# Patient Record
Sex: Female | Born: 1990 | Race: Black or African American | Hispanic: No | Marital: Single | Smoking: Never smoker
Health system: Southern US, Community
[De-identification: ages and names within clinical notes are randomized; demographics above are authoritative.]

---

## 2015-07-09 ENCOUNTER — Emergency Department: Payer: Medicaid Other

## 2015-07-09 ENCOUNTER — Emergency Department
Admission: EM | Admit: 2015-07-09 | Discharge: 2015-07-09 | Disposition: A | Discharge status: Home / Self Care | Payer: Medicaid Other | Attending: Emergency Medicine | Admitting: Emergency Medicine

## 2015-07-09 ENCOUNTER — Encounter: Payer: Self-pay | Admitting: Emergency Medicine

## 2015-07-09 DIAGNOSIS — R197 Diarrhea, unspecified: Secondary | ICD-10-CM | POA: Diagnosis not present

## 2015-07-09 DIAGNOSIS — R112 Nausea with vomiting, unspecified: Secondary | ICD-10-CM

## 2015-07-09 DIAGNOSIS — R1011 Right upper quadrant pain: Secondary | ICD-10-CM | POA: Diagnosis not present

## 2015-07-09 DIAGNOSIS — Z3202 Encounter for pregnancy test, result negative: Secondary | ICD-10-CM | POA: Diagnosis not present

## 2015-07-09 DIAGNOSIS — R109 Unspecified abdominal pain: Secondary | ICD-10-CM

## 2015-07-09 LAB — COMPREHENSIVE METABOLIC PANEL
ALK PHOS: 103 U/L (ref 38–126)
ALT: 44 U/L (ref 14–54)
AST: 31 U/L (ref 15–41)
Albumin: 4 g/dL (ref 3.5–5.0)
Anion gap: 10 (ref 5–15)
BUN: 8 mg/dL (ref 6–20)
CALCIUM: 8.7 mg/dL — AB (ref 8.9–10.3)
CO2: 18 mmol/L — ABNORMAL LOW (ref 22–32)
CREATININE: 0.65 mg/dL (ref 0.44–1.00)
Chloride: 107 mmol/L (ref 101–111)
Glucose, Bld: 129 mg/dL — ABNORMAL HIGH (ref 65–99)
Potassium: 4.3 mmol/L (ref 3.5–5.1)
Sodium: 135 mmol/L (ref 135–145)
Total Bilirubin: <0.1 mg/dL — ABNORMAL LOW (ref 0.3–1.2)
Total Protein: 7.9 g/dL (ref 6.5–8.1)

## 2015-07-09 LAB — CBC WITH DIFFERENTIAL/PLATELET
BASOS ABS: 0 10*3/uL (ref 0–0.1)
Basophils Relative: 0 %
Eosinophils Absolute: 0 10*3/uL (ref 0–0.7)
Eosinophils Relative: 0 %
HEMATOCRIT: 38 % (ref 35.0–47.0)
HEMOGLOBIN: 12.5 g/dL (ref 12.0–16.0)
LYMPHS PCT: 10 %
Lymphs Abs: 1.1 10*3/uL (ref 1.0–3.6)
MCH: 26.4 pg (ref 26.0–34.0)
MCHC: 32.9 g/dL (ref 32.0–36.0)
MCV: 80.2 fL (ref 80.0–100.0)
MONO ABS: 0.5 10*3/uL (ref 0.2–0.9)
Monocytes Relative: 5 %
NEUTROS ABS: 8.9 10*3/uL — AB (ref 1.4–6.5)
NEUTROS PCT: 85 %
Platelets: 274 10*3/uL (ref 150–440)
RBC: 4.74 MIL/uL (ref 3.80–5.20)
RDW: 15 % — AB (ref 11.5–14.5)
WBC: 10.5 10*3/uL (ref 3.6–11.0)

## 2015-07-09 LAB — URINALYSIS COMPLETE WITH MICROSCOPIC (ARMC ONLY)
Bilirubin Urine: NEGATIVE
GLUCOSE, UA: NEGATIVE mg/dL
Ketones, ur: NEGATIVE mg/dL
NITRITE: NEGATIVE
Protein, ur: NEGATIVE mg/dL
SPECIFIC GRAVITY, URINE: 1.024 (ref 1.005–1.030)
pH: 6 (ref 5.0–8.0)

## 2015-07-09 LAB — POCT PREGNANCY, URINE: PREG TEST UR: NEGATIVE

## 2015-07-09 LAB — LIPASE, BLOOD: Lipase: 19 U/L (ref 11–51)

## 2015-07-09 MED ORDER — ONDANSETRON HCL 4 MG/2ML IJ SOLN
4.0000 mg | Freq: Once | INTRAMUSCULAR | Status: AC
  Administered 2015-07-09: 4 mg via INTRAVENOUS
  Filled 2015-07-09: qty 2

## 2015-07-09 MED ORDER — IOHEXOL 350 MG/ML SOLN
100.0000 mL | Freq: Once | INTRAVENOUS | Status: AC | PRN
Start: 2015-07-09 — End: 2015-07-09
  Administered 2015-07-09: 100 mL via INTRAVENOUS

## 2015-07-09 MED ORDER — IOHEXOL 240 MG/ML SOLN
25.0000 mL | Freq: Once | INTRAMUSCULAR | Status: AC | PRN
  Administered 2015-07-09: 25 mL via ORAL

## 2015-07-09 MED ORDER — DICYCLOMINE HCL 20 MG PO TABS: 20.0000 mg | ORAL_TABLET | Freq: Three times a day (TID) | ORAL | Status: AC | PRN

## 2015-07-09 MED ORDER — ONDANSETRON HCL 4 MG PO TABS: 4.0000 mg | ORAL_TABLET | Freq: Every day | ORAL | Status: AC | PRN

## 2015-07-09 MED ORDER — MORPHINE SULFATE (PF) 2 MG/ML IV SOLN
2.0000 mg | Freq: Once | INTRAVENOUS | Status: AC
  Administered 2015-07-09: 2 mg via INTRAVENOUS
  Filled 2015-07-09: qty 1

## 2015-07-09 MED ORDER — SODIUM CHLORIDE 0.9 % IV BOLUS (SEPSIS)
1000.0000 mL | Freq: Once | INTRAVENOUS | Status: AC
  Administered 2015-07-09: 1000 mL via INTRAVENOUS

## 2015-07-09 NOTE — ED Notes (Signed)
IV in rt ac per pt preference

## 2015-07-09 NOTE — ED Notes (Signed)
Ppt out of from to ct

## 2015-07-09 NOTE — Discharge Instructions (Signed)
Abdominal Pain, Adult °Many things can cause abdominal pain. Usually, abdominal pain is not caused by a disease and will improve without treatment. It can often be observed and treated at home. Your health care provider will do a physical exam and possibly order blood tests and X-rays to help determine the seriousness of your pain. However, in many cases, more time must pass before a clear cause of the pain can be found. Before that point, your health care provider may not know if you need more testing or further treatment. °HOME CARE INSTRUCTIONS °Monitor your abdominal pain for any changes. The following actions may help to alleviate any discomfort you are experiencing: °· Only take over-the-counter or prescription medicines as directed by your health care provider. °· Do not take laxatives unless directed to do so by your health care provider. °· Try a clear liquid diet (broth, tea, or water) as directed by your health care provider. Slowly move to a bland diet as tolerated. °SEEK MEDICAL CARE IF: °· You have unexplained abdominal pain. °· You have abdominal pain associated with nausea or diarrhea. °· You have pain when you urinate or have a bowel movement. °· You experience abdominal pain that wakes you in the night. °· You have abdominal pain that is worsened or improved by eating food. °· You have abdominal pain that is worsened with eating fatty foods. °· You have a fever. °SEEK IMMEDIATE MEDICAL CARE IF: °· Your pain does not go away within 2 hours. °· You keep throwing up (vomiting). °· Your pain is felt only in portions of the abdomen, such as the right side or the left lower portion of the abdomen. °· You pass bloody or black tarry stools. °MAKE SURE YOU: °· Understand these instructions. °· Will watch your condition. °· Will get help right away if you are not doing well or get worse. °  °This information is not intended to replace advice given to you by your health care provider. Make sure you discuss  any questions you have with your health care provider. °  °Document Released: 02/25/2005 Document Revised: 02/06/2015 Document Reviewed: 01/25/2013 °Elsevier Interactive Patient Education ©2016 [employer] ° °Diarrhea °Diarrhea is frequent loose and watery bowel movements. It can cause you to feel weak and dehydrated. Dehydration can cause you to become tired and thirsty, have a dry mouth, and have decreased urination that often is dark yellow. Diarrhea is a sign of another problem, most often an infection that will not last long. In most cases, diarrhea typically lasts 2-3 days. However, it can last longer if it is a sign of something more serious. It is important to treat your diarrhea as directed by your caregiver to lessen or prevent future episodes of diarrhea. °CAUSES  °Some common causes include: °· Gastrointestinal infections caused by viruses, bacteria, or parasites. °· Food poisoning or food allergies. °· Certain medicines, such as antibiotics, chemotherapy, and laxatives. °· Artificial sweeteners and fructose. °· Digestive disorders. °HOME CARE INSTRUCTIONS °· Ensure adequate fluid intake (hydration): Have 1 cup (8 oz) of fluid for each diarrhea episode. Avoid fluids that contain simple sugars or sports drinks, fruit juices, whole milk products, and sodas. Your urine should be clear or pale yellow if you are drinking enough fluids. Hydrate with an oral rehydration solution that you can purchase at pharmacies, retail stores, and online. You can prepare an oral rehydration solution at home by mixing the following ingredients together: °·  - tsp table salt. °· ¾ tsp baking soda. °·    tsp salt substitute containing potassium chloride. °· 1  tablespoons sugar. °· 1 L (34 oz) of water. °· Certain foods and beverages may increase the speed at which food moves through the gastrointestinal (GI) tract. These foods and beverages should be avoided and include: °· Caffeinated and alcoholic beverages. °· High-fiber  foods, such as raw fruits and vegetables, nuts, seeds, and whole grain breads and cereals. °· Foods and beverages sweetened with sugar alcohols, such as xylitol, sorbitol, and mannitol. °· Some foods may be well tolerated and may help thicken stool including: °· Starchy foods, such as rice, toast, pasta, low-sugar cereal, oatmeal, grits, baked potatoes, crackers, and bagels. °· Bananas. °· Applesauce. °· Add probiotic-rich foods to help increase healthy bacteria in the GI tract, such as yogurt and fermented milk products. °· Wash your hands well after each diarrhea episode. °· Only take over-the-counter or prescription medicines as directed by your caregiver. °· Take a warm bath to relieve any burning or pain from frequent diarrhea episodes. °SEEK IMMEDIATE MEDICAL CARE IF:  °· You are unable to keep fluids down. °· You have persistent vomiting. °· You have blood in your stool, or your stools are black and tarry. °· You do not urinate in 6-8 hours, or there is only a small amount of very dark urine. °· You have abdominal pain that increases or localizes. °· You have weakness, dizziness, confusion, or light-headedness. °· You have a severe headache. °· Your diarrhea gets worse or does not get better. °· You have a fever or persistent symptoms for more than 2-3 days. °· You have a fever and your symptoms suddenly get worse. °MAKE SURE YOU:  °· Understand these instructions. °· Will watch your condition. °· Will get help right away if you are not doing well or get worse. °  °This information is not intended to replace advice given to you by your health care provider. Make sure you discuss any questions you have with your health care provider. °  °Document Released: 05/08/2002 Document Revised: 06/08/2014 Document Reviewed: 01/24/2012 °Elsevier Interactive Patient Education ©2016 [employer] ° °Nausea and Vomiting °Nausea is a sick feeling that often comes before throwing up (vomiting). Vomiting is a reflex where  stomach contents come out of your mouth. Vomiting can cause severe loss of body fluids (dehydration). Children and elderly adults can become dehydrated quickly, especially if they also have diarrhea. Nausea and vomiting are symptoms of a condition or disease. It is important to find the cause of your symptoms. °CAUSES  °· Direct irritation of the stomach lining. This irritation can result from increased acid production (gastroesophageal reflux disease), infection, food poisoning, taking certain medicines (such as nonsteroidal anti-inflammatory drugs), alcohol use, or tobacco use. °· Signals from the brain. These signals could be caused by a headache, heat exposure, an inner ear disturbance, increased pressure in the brain from injury, infection, a tumor, or a concussion, pain, emotional stimulus, or metabolic problems. °· An obstruction in the gastrointestinal tract (bowel obstruction). °· Illnesses such as diabetes, hepatitis, gallbladder problems, appendicitis, kidney problems, cancer, sepsis, atypical symptoms of a heart attack, or eating disorders. °· Medical treatments such as chemotherapy and radiation. °· Receiving medicine that makes you sleep (general anesthetic) during surgery. °DIAGNOSIS °Your caregiver may ask for tests to be done if the problems do not improve after a few days. Tests may also be done if symptoms are severe or if the reason for the nausea and vomiting is not clear. Tests may include: °· Urine tests. °·   Blood tests. °· Stool tests. °· Cultures (to look for evidence of infection). °· X-rays or other imaging studies. °Test results can help your caregiver make decisions about treatment or the need for additional tests. °TREATMENT °You need to stay well hydrated. Drink frequently but in small amounts. You may wish to drink water, sports drinks, clear broth, or eat frozen ice pops or gelatin dessert to help stay hydrated. When you eat, eating slowly may help prevent nausea. There are also some  antinausea medicines that may help prevent nausea. °HOME CARE INSTRUCTIONS  °· Take all medicine as directed by your caregiver. °· If you do not have an appetite, do not force yourself to eat. However, you must continue to drink fluids. °· If you have an appetite, eat a normal diet unless your caregiver tells you differently. °¨ Eat a variety of complex carbohydrates (rice, wheat, potatoes, bread), lean meats, yogurt, fruits, and vegetables. °¨ Avoid high-fat foods because they are more difficult to digest. °· Drink enough water and fluids to keep your urine clear or pale yellow. °· If you are dehydrated, ask your caregiver for specific rehydration instructions. Signs of dehydration may include: °¨ Severe thirst. °¨ Dry lips and mouth. °¨ Dizziness. °¨ Dark urine. °¨ Decreasing urine frequency and amount. °¨ Confusion. °¨ Rapid breathing or pulse. °SEEK IMMEDIATE MEDICAL CARE IF:  °· You have blood or brown flecks (like coffee grounds) in your vomit. °· You have black or bloody stools. °· You have a severe headache or stiff neck. °· You are confused. °· You have severe abdominal pain. °· You have chest pain or trouble breathing. °· You do not urinate at least once every 8 hours. °· You develop cold or clammy skin. °· You continue to vomit for longer than 24 to 48 hours. °· You have a fever. °MAKE SURE YOU:  °· Understand these instructions. °· Will watch your condition. °· Will get help right away if you are not doing well or get worse. °  °This information is not intended to replace advice given to you by your health care provider. Make sure you discuss any questions you have with your health care provider. °  °Document Released: 05/18/2005 Document Revised: 08/10/2011 Document Reviewed: 10/15/2010 °Elsevier Interactive Patient Education ©2016 Elsevier Inc. ° °

## 2015-07-09 NOTE — ED Notes (Signed)
Patient reports abdominal pain with N/V/D that began at 0400 this am. States she has had two episodes of vomiting in last hour, 3 episodes of diarrhea in last hour. Patient denies any fevers. States pain began in middle of abdomen, now having pain to RUQ.

## 2015-07-09 NOTE — ED Notes (Signed)
Patient's purple top tube hemolyzed per lab. Patient was difficult stick with slow blood draw. Will wait until patient is in room and IV is placed to recollect.

## 2015-07-09 NOTE — ED Provider Notes (Signed)
[facility] Emergency Department Provider Note  ____________________________________________  Time seen: Approximately 325 PM  I have reviewed the triage vital signs and the nursing notes.   HISTORY  Chief Complaint Abdominal Pain; Diarrhea; and Emesis    HPI Debra Berry is a 25 y.o. female with no chronic medical problems was presenting to the emergency department today with nausea, vomiting and diarrhea and right upper quadrant abdominal pain. She said that she was awoken this morning by right upper quadrant abdominal pain at 4 AM. She subsequently had 4 episodes of diarrhea and 3 episodes of vomiting. She said that the right upper quadrant pain was initially cramping and then became sharp and constant. It does not radiate anywhere. She has no history of any intra-abdominal surgeries. She does not report any vaginal discharge or vaginal bleeding. She denies any burning with urination. She has no known sick contacts. Denies any blood in her stool or her vomitus.   History reviewed. No pertinent past medical history.  There are no active problems to display for this patient.   History reviewed. No pertinent past surgical history.  No current outpatient prescriptions on file.  Allergies Shrimp and Tomato  No family history on file.  Social History Social History  Substance Use Topics  . Smoking status: Never Smoker   . Smokeless tobacco: None  . Alcohol Use: No    Review of Systems Constitutional: No fever/chills Eyes: No visual changes. ENT: No sore throat. Cardiovascular: Denies chest pain. Respiratory: Denies shortness of breath. Gastrointestinal:  No constipation. Genitourinary: Negative for dysuria. Musculoskeletal: Negative for back pain. Skin: Negative for rash. Neurological: Negative for headaches, focal weakness or numbness.  10-point ROS otherwise negative.  ____________________________________________   PHYSICAL  EXAM:  VITAL SIGNS: ED Triage Vitals  Enc Vitals Group     BP 07/09/15 1300 124/73 mmHg     Pulse Rate 07/09/15 1300 78     Resp 07/09/15 1300 20     Temp 07/09/15 1300 98.1 F (36.7 C)     Temp Source 07/09/15 1300 Oral     SpO2 07/09/15 1300 99 %     Weight 07/09/15 1303 298 lb (135.172 kg)     Height 07/09/15 1303  (1.803 m)     Head Cir --      Peak Flow --      Pain Score 07/09/15 1303 10     Pain Loc --      Pain Edu? --      Excl. in GC? --     Constitutional: Alert and oriented. Well appearing and in no acute distress. Eyes: Conjunctivae are normal. PERRL. EOMI. Head: Atraumatic. Nose: No congestion/rhinnorhea. Mouth/Throat: Mucous membranes are moist.  Neck: No stridor.   Cardiovascular: Normal rate, regular rhythm. Grossly normal heart sounds.  Good peripheral circulation. Respiratory: Normal respiratory effort.  No retractions. Lungs CTAB. Gastrointestinal: Soft with mild tenderness to palpation to the right upper quadrant and the right side of the abdomen. There is no right lower quadrant tenderness to palpation. No distention. No abdominal bruits. No CVA tenderness. Musculoskeletal: No lower extremity tenderness nor edema.  No joint effusions. Neurologic:  Normal speech and language. No gross focal neurologic deficits are appreciated. No gait instability. Skin:  Skin is warm, dry and intact. No rash noted. Psychiatric: Mood and affect are normal. Speech and behavior are normal.  ____________________________________________   LABS (all labs ordered are listed, but only abnormal results are displayed)  Labs Reviewed  COMPREHENSIVE METABOLIC PANEL - Abnormal; Notable for the following:    CO2 18 (*)    Glucose, Bld 129 (*)    Calcium 8.7 (*)    Total Bilirubin <0.1 (*)    All other components within normal limits  URINALYSIS COMPLETEWITH MICROSCOPIC ([facility] ONLY) - Abnormal; Notable for the following:    Color, Urine YELLOW (*)    APPearance HAZY (*)     Hgb urine dipstick 1+ (*)    Leukocytes, UA 1+ (*)    Bacteria, UA RARE (*)    Squamous Epithelial / LPF 6-30 (*)    All other components within normal limits  CBC WITH DIFFERENTIAL/PLATELET - Abnormal; Notable for the following:    RDW 15.0 (*)    Neutro Abs 8.9 (*)    All other components within normal limits  LIPASE, BLOOD  POC URINE PREG, ED  POCT PREGNANCY, URINE   ____________________________________________  EKG  ED ECG REPORT I, Arelia Longest, the attending physician, personally viewed and interpreted this ECG.   Date: 07/09/2015  EKG Time: 1307  Rate: 72  Rhythm: normal sinus rhythm  Axis: Normal axis  Intervals:none  ST&T Change: No ST segment elevation or depression. No abnormal T-wave inversion.  ____________________________________________  RADIOLOGY  Normal enhanced CT scan of the abdomen and pelvis with a normal visualized appendix. ____________________________________________   PROCEDURES    ____________________________________________   INITIAL IMPRESSION / ASSESSMENT AND PLAN / ED COURSE  Pertinent labs & imaging results that were available during my care of the patient were reviewed by me and considered in my medical decision making (see chart for details).  ----------------------------------------- 5:50 PM on 07/09/2015 -----------------------------------------  Patient with very reassuring labs as well as CAT scan the abdomen and pelvis. Feeling much improved after morphine and Zofran. Tolerated by mouth fluids. Will be discharged home with Zofran as well as Bentyl. Explained to the patient that the most was the cause of her symptoms is a virus.Return precautions such as any worsening pain or nausea or vomiting. Will follow-up with her primary care doctor at [facility]. Reviewed the CAT scan results with her as well as her vomiting party. ____________________________________________   FINAL CLINICAL IMPRESSION(S) / ED  DIAGNOSES  Abdominal pain with nausea vomiting and diarrhea.    Myrna Blazer, MD 07/09/15 [phone]

## 2015-07-09 NOTE — ED Notes (Signed)
Pt returned from ct

## 2017-06-01 ENCOUNTER — Encounter: Payer: Self-pay | Admitting: Emergency Medicine

## 2017-06-01 ENCOUNTER — Emergency Department: Payer: Medicaid Other

## 2017-06-01 ENCOUNTER — Emergency Department
Admission: EM | Admit: 2017-06-01 | Discharge: 2017-06-01 | Disposition: A | Discharge status: Home / Self Care | Payer: Medicaid Other | Attending: Emergency Medicine | Admitting: Emergency Medicine

## 2017-06-01 ENCOUNTER — Other Ambulatory Visit: Payer: Self-pay

## 2017-06-01 DIAGNOSIS — M25571 Pain in right ankle and joints of right foot: Secondary | ICD-10-CM | POA: Diagnosis present

## 2017-06-01 DIAGNOSIS — R2241 Localized swelling, mass and lump, right lower limb: Secondary | ICD-10-CM | POA: Insufficient documentation

## 2017-06-01 DIAGNOSIS — Z23 Encounter for immunization: Secondary | ICD-10-CM | POA: Insufficient documentation

## 2017-06-01 DIAGNOSIS — S99911A Unspecified injury of right ankle, initial encounter: Secondary | ICD-10-CM

## 2017-06-01 MED ORDER — TETANUS-DIPHTH-ACELL PERTUSSIS 5-2.5-18.5 LF-MCG/0.5 IM SUSP
0.5000 mL | Freq: Once | INTRAMUSCULAR | Status: AC
  Administered 2017-06-01: 0.5 mL via INTRAMUSCULAR
  Filled 2017-06-01: qty 0.5

## 2017-06-01 MED ORDER — KETOROLAC TROMETHAMINE 30 MG/ML IJ SOLN
30.0000 mg | Freq: Once | INTRAMUSCULAR | Status: AC
  Administered 2017-06-01: 30 mg via INTRAMUSCULAR
  Filled 2017-06-01: qty 1

## 2017-06-01 MED ORDER — IBUPROFEN 800 MG PO TABS: 800.0000 mg | ORAL_TABLET | Freq: Three times a day (TID) | ORAL | 0 refills | Status: AC | PRN

## 2017-06-01 NOTE — ED Provider Notes (Signed)
[facility] Emergency Department Provider Note  ____________________________________________  Time seen: Approximately 6:02 PM  I have reviewed the triage vital signs and the nursing notes.   HISTORY  Chief Complaint Ankle Pain    HPI Debra Berry is a 27 y.o. female that presents to the emergency department for evaluation of right ankle injury  yesterday.  Patient states that her daughter fell and grabbed onto her, which caused her to fall as well.  She is not sure which way her ankle twisted.  She had some numbness last night but this resolved.  She has an abrasion on her right knee.  She has not taken anything for pain.  She iced her ankle this morning.  She is unsure of last tetanus shot.  No additional injuries or concerns.    History reviewed. No pertinent past medical history.  There are no active problems to display for this patient.   History reviewed. No pertinent surgical history.  Prior to Admission medications   Medication Sig Start Date End Date Taking? Authorizing Provider  dicyclomine (BENTYL) 20 MG tablet Take 1 tablet (20 mg total) by mouth 3 (three) times daily as needed for spasms. 07/09/15 07/08/16  Myrna Blazer, MD  ibuprofen (ADVIL,MOTRIN) 800 MG tablet Take 1 tablet (800 mg total) by mouth every 8 (eight) hours as needed. 06/01/17   Enid Derry, PA-C  ondansetron (ZOFRAN) 4 MG tablet Take 1 tablet (4 mg total) by mouth daily as needed. 07/09/15   Myrna Blazer, MD    Allergies Shrimp [shellfish allergy] and Tomato  No family history on file.  Social History Social History   Tobacco Use  . Smoking status: Never Smoker  Substance Use Topics  . Alcohol use: No  . Drug use: Not on file     Review of Systems  Constitutional: No fever/chills Cardiovascular: No chest pain. Respiratory: No SOB. Gastrointestinal: No abdominal pain.   Musculoskeletal: Positive for ankle pain. Skin: Negative for rash,  ecchymosis. Neurological: Negative for tingling   ____________________________________________   PHYSICAL EXAM:  VITAL SIGNS: ED Triage Vitals  Enc Vitals Group     BP 06/01/17 1656 134/90     Pulse Rate 06/01/17 1656 83     Resp 06/01/17 1656 20     Temp 06/01/17 1656 98.2 F (36.8 C)     Temp Source 06/01/17 1656 Oral     SpO2 06/01/17 1656 99 %     Weight 06/01/17 1657 295 lb (133.8 kg)     Height 06/01/17 1657 5\' 11"  (1.803 m)     Head Circumference --      Peak Flow --      Pain Score 06/01/17 1656 10     Pain Loc --      Pain Edu? --      Excl. in GC? --      Constitutional: Alert and oriented. Well appearing and in no acute distress. Eyes: Conjunctivae are normal. PERRL. EOMI. Head: Atraumatic. ENT:      Ears:      Nose: No congestion/rhinnorhea.      Mouth/Throat: Mucous membranes are moist.  Neck: No stridor.  Cardiovascular: Normal rate, regular rhythm.  Good peripheral circulation.  Symmetric dorsalis pedis pulses bilaterally. Respiratory: Normal respiratory effort without tachypnea or retractions. Lungs CTAB. Good air entry to the bases with no decreased or absent breath sounds. Musculoskeletal: Full range of motion to all extremities. No gross deformities appreciated.  Mild tenderness to palpation and swelling  over lateral and anterior right ankle. No erythema. Neurologic:  Normal speech and language. No gross focal neurologic deficits are appreciated.  Skin:  Skin is warm, dry.  1 inch x 1 inch abrasion to right knee.   ____________________________________________   LABS (all labs ordered are listed, but only abnormal results are displayed)  Labs Reviewed - No data to display ____________________________________________  EKG   ____________________________________________  RADIOLOGY Lexine BatonI, Brynne Doane, personally viewed and evaluated these images (plain radiographs) as part of my medical decision making, as well as reviewing the written report by  the radiologist.  Dg Ankle Complete Right  Result Date: 06/01/2017 CLINICAL DATA:  Status post fall yesterday twisted ankle with pain. EXAM: RIGHT ANKLE - COMPLETE 3+ VIEW COMPARISON:  None. FINDINGS: There is no evidence of fracture, dislocation, or joint effusion. There is no evidence of arthropathy or other focal bone abnormality. Lateral soft tissue swelling is noted. IMPRESSION: No acute fracture or dislocation.  Lateral soft tissue swelling. Electronically Signed   By: Sherian ReinWei-Chen  Lin M.D.   On: 06/01/2017 17:47    ____________________________________________    PROCEDURES  Procedure(s) performed:    Procedures    Medications  ketorolac (TORADOL) 30 MG/ML injection 30 mg (30 mg Intramuscular Given 06/01/17 1837)  Tdap (BOOSTRIX) injection 0.5 mL (0.5 mLs Intramuscular Given 06/01/17 1838)     ____________________________________________   INITIAL IMPRESSION / ASSESSMENT AND PLAN / ED COURSE  Pertinent labs & imaging results that were available during my care of the patient were reviewed by me and considered in my medical decision making (see chart for details).  Review of the [place] CSRS was performed in accordance of the [facility] prior to dispensing any controlled drugs.   Patient presents emergency department for evaluation of ankle injury.  Vital signs and exam are reassuring.  X-ray consistent with lateral malleolus swelling.  Patient will be discharged home with prescriptions for ibuprofen. Patient is to follow up with PCP as directed. Patient is given ED precautions to return to the ED for any worsening or new symptoms.     ____________________________________________  FINAL CLINICAL IMPRESSION(S) / ED DIAGNOSES  Final diagnoses:  Injury of right ankle, initial encounter      NEW MEDICATIONS STARTED DURING THIS VISIT:  ED Discharge Orders        Ordered    ibuprofen (ADVIL,MOTRIN) 800 MG tablet  Every 8 hours PRN     06/01/17 1801          This chart was  dictated using voice recognition software/Dragon. Despite best efforts to proofread, errors can occur which can change the meaning. Any change was purely unintentional.    Enid DerryWagner, Guilherme Schwenke, PA-C 06/01/17 2026    Sharyn CreamerQuale, Mark, MD 06/01/17 2332

## 2017-06-01 NOTE — ED Triage Notes (Signed)
R ankle pain since fell yesterday.

## 2017-06-01 NOTE — ED Notes (Signed)
Pt reports that she injured right ankle when she slipped and fell yesterday - unable to bear weight on right ankle

## 2018-02-02 ENCOUNTER — Encounter: Payer: Self-pay | Admitting: Emergency Medicine

## 2018-02-02 ENCOUNTER — Emergency Department
Admission: EM | Admit: 2018-02-02 | Discharge: 2018-02-02 | Disposition: A | Discharge status: Home / Self Care | Payer: Medicaid Other | Attending: Emergency Medicine | Admitting: Emergency Medicine

## 2018-02-02 ENCOUNTER — Other Ambulatory Visit: Payer: Self-pay

## 2018-02-02 ENCOUNTER — Emergency Department: Payer: Medicaid Other

## 2018-02-02 DIAGNOSIS — M79622 Pain in left upper arm: Secondary | ICD-10-CM | POA: Diagnosis not present

## 2018-02-02 DIAGNOSIS — M79602 Pain in left arm: Secondary | ICD-10-CM

## 2018-02-02 MED ORDER — TRAMADOL HCL 50 MG PO TABS: 50.0000 mg | ORAL_TABLET | Freq: Four times a day (QID) | ORAL | 0 refills | Status: AC | PRN

## 2018-02-02 NOTE — Discharge Instructions (Signed)
Follow-up with your primary care provider in [facility] for any continued issues with your left arm.  Ultrasound today does not show any signs of a blood clot.  Continue taking prednisone until finished.  Tramadol as needed for pain.  After finishing the prednisone to begin taking an anti-inflammatory such as Aleve.  Take 2 twice a day which will equal the prescription strength.  Use warm compresses to the area 4 times a day.

## 2018-02-02 NOTE — ED Triage Notes (Signed)
Pt had birth control implant removed last week, has had left arm pain ever since, area with out redness or warmth.  Pt in NAD.

## 2018-02-02 NOTE — ED Provider Notes (Signed)
[facility] Emergency Department Provider Note   ____________________________________________   None    (approximate)  I have reviewed the triage vital signs and the nursing notes.   HISTORY  Chief Complaint Arm Pain   HPI Debra Berry is a 27 y.o. female presents to the emergency department with complaint of left upper arm pain.  Patient states that she had a birth control implant in her left arm that was removed on August 9 and a new one replaced.  She began having pain after 1 week.  She is continued to have pain for the last 3 weeks and saw her PCP last week and was placed on prednisone.  Currently she is taking a tapering dose which has not helped with the pain.  She denies any  fever, chills, nausea or vomiting.  She is unaware of any erythema to the area.  Currently she rates her pain as a 9/10.  History reviewed. No pertinent past medical history.  There are no active problems to display for this patient.   History reviewed. No pertinent surgical history.  Prior to Admission medications   Medication Sig Start Date End Date Taking? Authorizing Provider  dicyclomine (BENTYL) 20 MG tablet Take 1 tablet (20 mg total) by mouth 3 (three) times daily as needed for spasms. 07/09/15 07/08/16  Myrna Blazer, MD  ibuprofen (ADVIL,MOTRIN) 800 MG tablet Take 1 tablet (800 mg total) by mouth every 8 (eight) hours as needed. 06/01/17   Enid Derry, PA-C  ondansetron (ZOFRAN) 4 MG tablet Take 1 tablet (4 mg total) by mouth daily as needed. 07/09/15   Schaevitz, Myra Rude, MD  traMADol (ULTRAM) 50 MG tablet Take 1 tablet (50 mg total) by mouth every 6 (six) hours as needed. 02/02/18   Tommi Rumps, PA-C    Allergies Shrimp [shellfish allergy] and Tomato  No family history on file.  Social History Social History   Tobacco Use  . Smoking status: Never Smoker  . Smokeless tobacco: Never Used  Substance Use Topics  . Alcohol use: No  .  Drug use: Not on file    Review of Systems Constitutional: No fever/chills Cardiovascular: Denies chest pain. Respiratory: Denies shortness of breath. Musculoskeletal: Positive left upper arm pain. Skin: Positive for incision left upper arm. Neurological: Negative for headaches, focal weakness or numbness. ___________________________________________   PHYSICAL EXAM:  VITAL SIGNS: ED Triage Vitals  Enc Vitals Group     BP 02/02/18 0831 123/80     Pulse Rate 02/02/18 0830 77     Resp 02/02/18 0830 16     Temp 02/02/18 0830 98.5 F (36.9 C)     Temp Source 02/02/18 0830 Oral     SpO2 02/02/18 0830 98 %     Weight 02/02/18 0831 296 lb (134.3 kg)     Height 02/02/18 0831 5\' 11"  (1.803 m)     Head Circumference --      Peak Flow --      Pain Score 02/02/18 0831 9     Pain Loc --      Pain Edu? --      Excl. in GC? --    Constitutional: Alert and oriented. Well appearing and in no acute distress. Eyes: Conjunctivae are normal.  Head: Atraumatic. Neck: No stridor.   Cardiovascular: Normal rate, regular rhythm. Grossly normal heart sounds.  Good peripheral circulation. Respiratory: Normal respiratory effort.  No retractions. Lungs CTAB. Musculoskeletal: On examination of left upper arm there is  no gross deformity however there is soft tissue tenderness at the site of the recent implant for birth control as well as the area in which the new implant was placed.  Range of motion is without crepitus or restriction.  No actual erythema or warmth is appreciated. Neurologic:  Normal speech and language. No gross focal neurologic deficits are appreciated.  Skin:  Skin is warm, dry and intact.  No erythema, ecchymosis or abrasions were seen.  There is a well healed scar from recent procedure.  No evidence of infection is noted. Psychiatric: Mood and affect are normal. Speech and behavior are normal.  ____________________________________________   LABS (all labs ordered are listed, but  only abnormal results are displayed)  Labs Reviewed - No data to display  RADIOLOGY   Official radiology report(s): US Venous Img Upper Uni Left  Result Date: 02/02/2018 CLINICAL DATA:  Left upper extremity pain for the past 3 weeks. Patient recently underwent pacemaker revision. Evaluate for DVT. EXAM: LEFT UPPER EXTREMITY VENOUS DOPPLER ULTRASOUND TECHNIQUE: Gray-scale sonography with graded compression, as well as color Doppler and duplex ultrasound were performed to evaluate the upper extremity deep venous system from the level of the subclavian vein and including the jugular, axillary, basilic, radial, ulnar and upper cephalic vein. Spectral Doppler was utilized to evaluate flow at rest and with distal augmentation maneuvers. COMPARISON:  None. FINDINGS: Contralateral Subclavian Vein: Respiratory phasicity is normal and symmetric with the symptomatic side. No evidence of thrombus. Normal compressibility. Internal Jugular Vein: No evidence of thrombus. Normal compressibility, respiratory phasicity and response to augmentation. Subclavian Vein: No evidence of thrombus. Normal compressibility, respiratory phasicity and response to augmentation. Axillary Vein: No evidence of thrombus. Normal compressibility, respiratory phasicity and response to augmentation. Cephalic Vein: No evidence of thrombus. Normal compressibility, respiratory phasicity and response to augmentation. Basilic Vein: No evidence of thrombus. Normal compressibility, respiratory phasicity and response to augmentation. Brachial Veins: No evidence of thrombus. Normal compressibility, respiratory phasicity and response to augmentation. Radial Veins: No evidence of thrombus. Normal compressibility, respiratory phasicity and response to augmentation. Ulnar Veins: No evidence of thrombus. Normal compressibility, respiratory phasicity and response to augmentation. Venous Reflux:  None visualized. Other Findings: There is a fairly well-defined  approximately 0.6 x 0.9 x 0.4 cm hyperechoic nodule within the subcutaneous tissues the upper outer arm, adjacent tube apparently separate from the adjacent widely patent cephalic vein (images 40 through 42). No definitive blood flow was demonstrated within this nodule. IMPRESSION: 1. No evidence of DVT within the left upper extremity. 2. Indeterminate approximately 0.9 cm hyperechoic nodule within the subcutaneous tissues of the left upper outer arm, nonspecific though potentially representative of a lipoma and of uncertain though doubtful clinical concern. Electronically Signed   By: Simonne Come M.D.   On: 02/02/2018 11:17    ____________________________________________   PROCEDURES  Procedure(s) performed: None  Procedures  Critical Care performed: No  ____________________________________________   INITIAL IMPRESSION / ASSESSMENT AND PLAN / ED COURSE  As part of my medical decision making, I reviewed the following data within the electronic MEDICAL RECORD NUMBER Notes from prior ED visits and  Controlled Substance Database  Patient presents to the ED with complaint of left upper arm pain for approximately 3 weeks.  Patient states that she had a birth control implant on August 9.  Patient was seen again for continued arm pain and placed on prednisone which has not given her any relief of her pain.  Venous ultrasound was done to rule out a  thrombosis and was reported as negative.  Patient was reassured.  She is to continue taking prednisone as directed and a prescription for tramadol was also given to her for moderate pain.  She is to follow-up with her PCP in [place] if any continued problems at this insertion site. ____________________________________________   FINAL CLINICAL IMPRESSION(S) / ED DIAGNOSES  Final diagnoses:  Left arm pain     ED Discharge Orders         Ordered    traMADol (ULTRAM) 50 MG tablet  Every 6 hours PRN     02/02/18 1139           Note:  This document  was prepared using Dragon voice recognition software and may include unintentional dictation errors.    Tommi Rumps, PA-C 02/02/18 1414    Nita Sickle, MD 02/04/18 [id number]

## 2018-02-02 NOTE — ED Notes (Signed)
Pt c/o left upper arm pain - she got birth control removed on August 9th and a new one replaced - since then she has had pain at the site that radiates into entire arm - pt went to PCP last week and obtained pain medication but received no relief

## 2018-12-30 IMAGING — US US EXTREM  UP VENOUS*L*
1 series · 13 of 24 positions shown · non-contrast
Comparison: None.

CLINICAL DATA: Left upper extremity pain for the past 3 weeks.
Patient recently underwent pacemaker revision. Evaluate for DVT.



[Series 1: us extrem up venous*left* · 0.09mm/px · 13 of 36 slices shown]
[im 1/36]
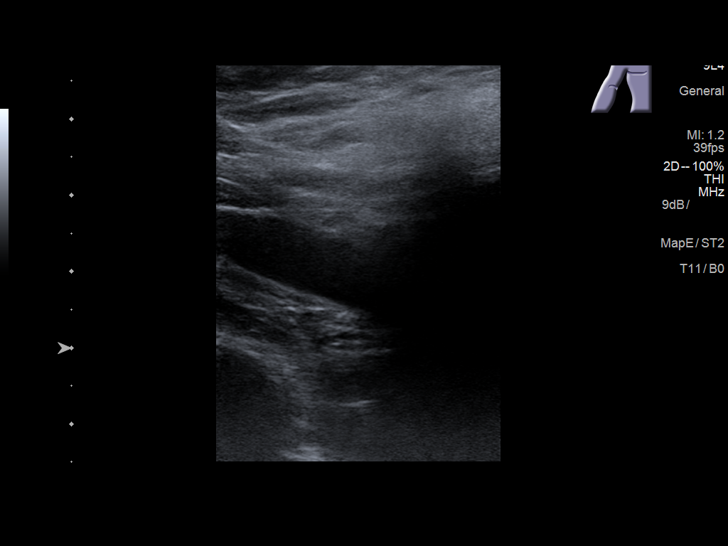
[im 4/36]
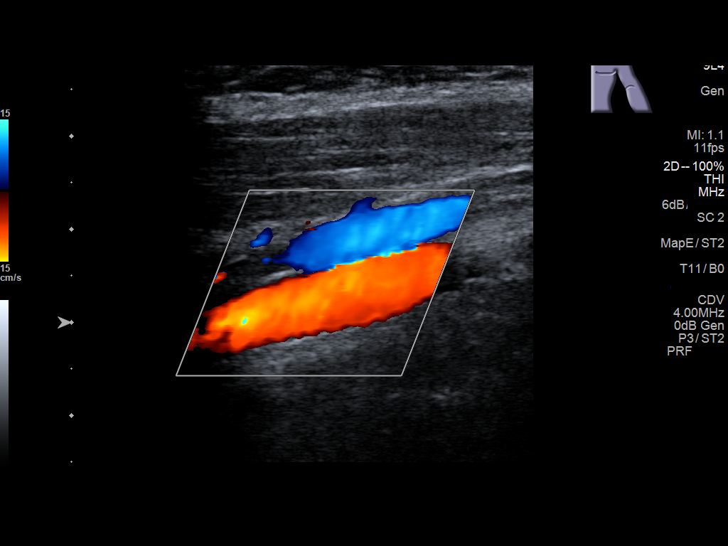
[im 7/36]
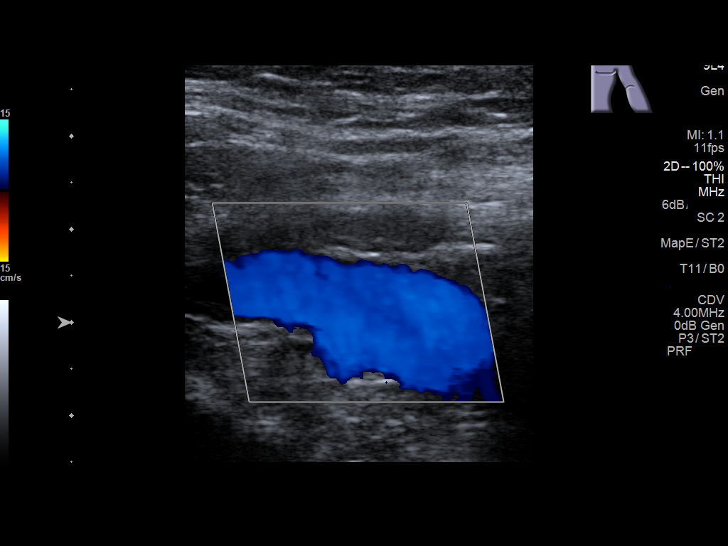
[im 10/36]
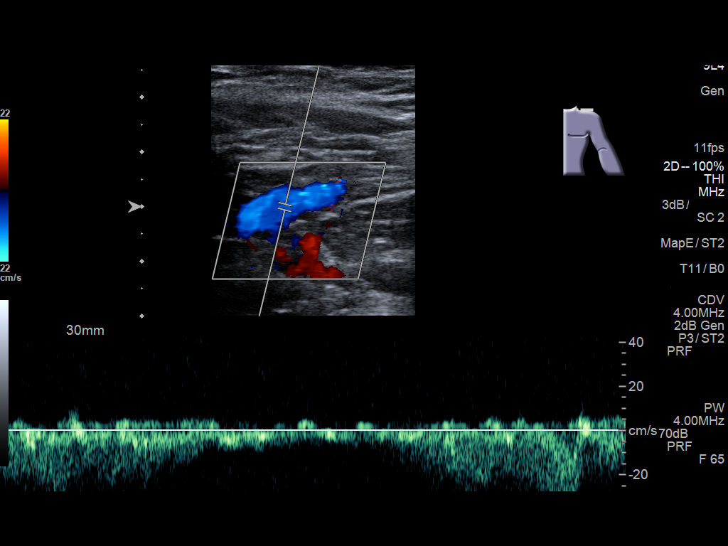
[im 13/36]
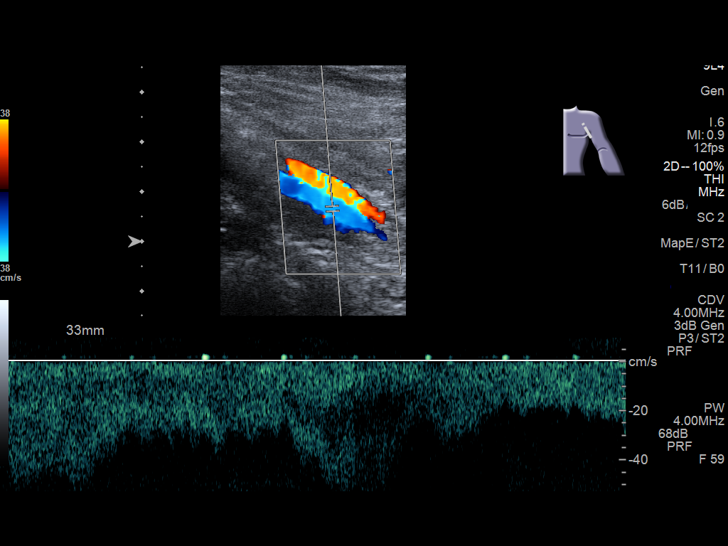
[im 16/36]
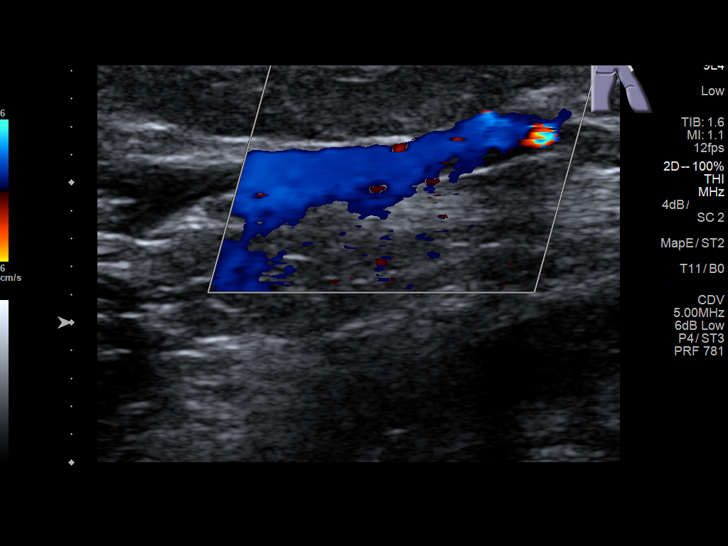
[im 19/36]
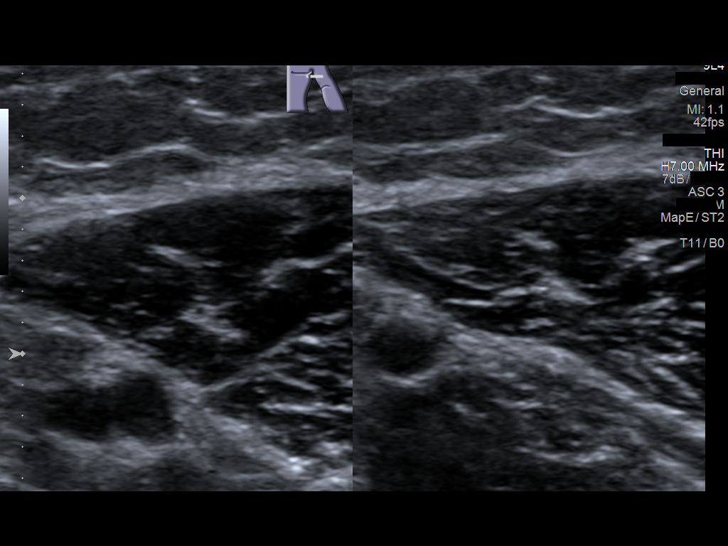
[im 20/36]
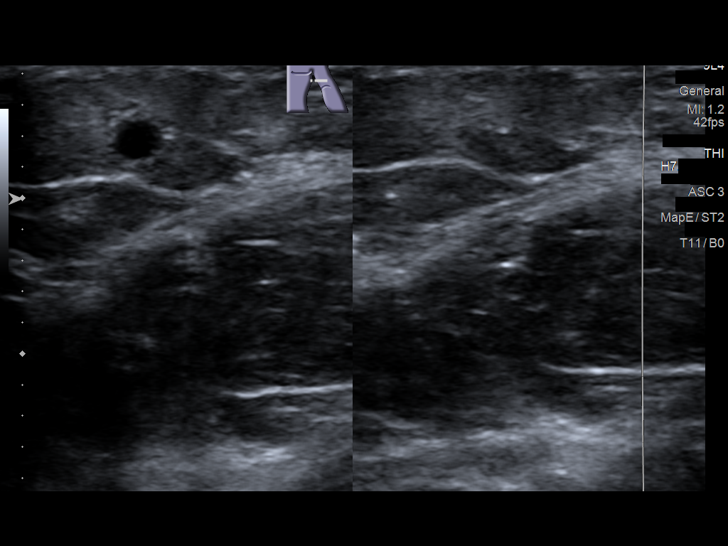
[im 23/36]
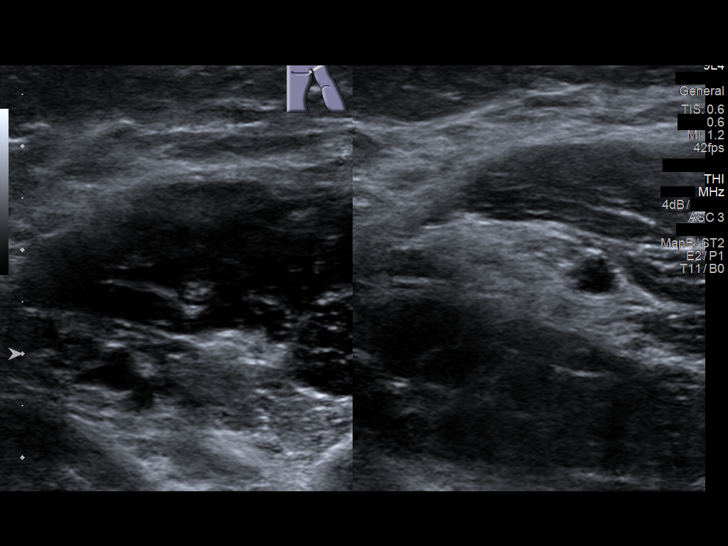
[im 26/36]
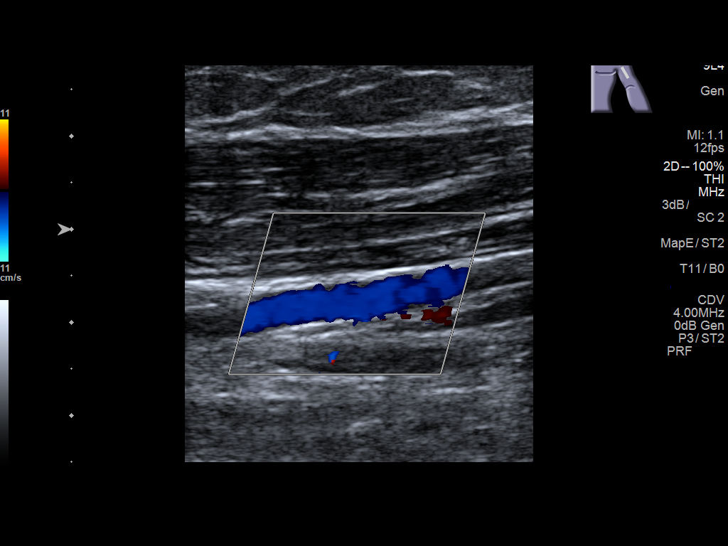
[im 29/36]
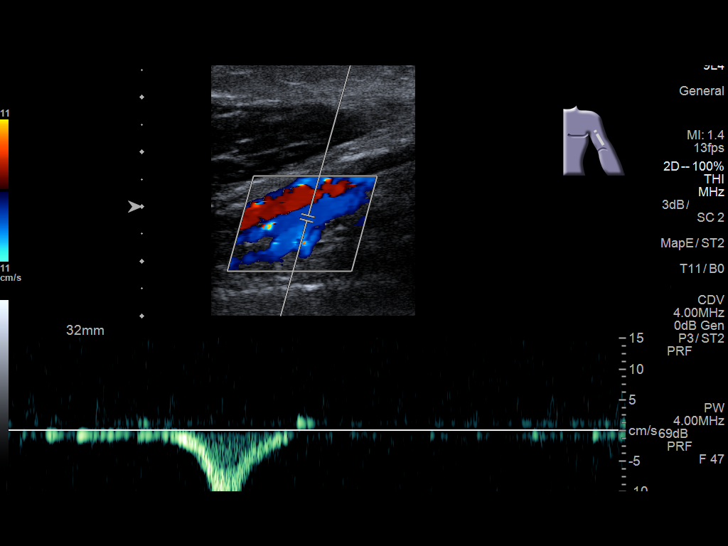
[im 32/36]
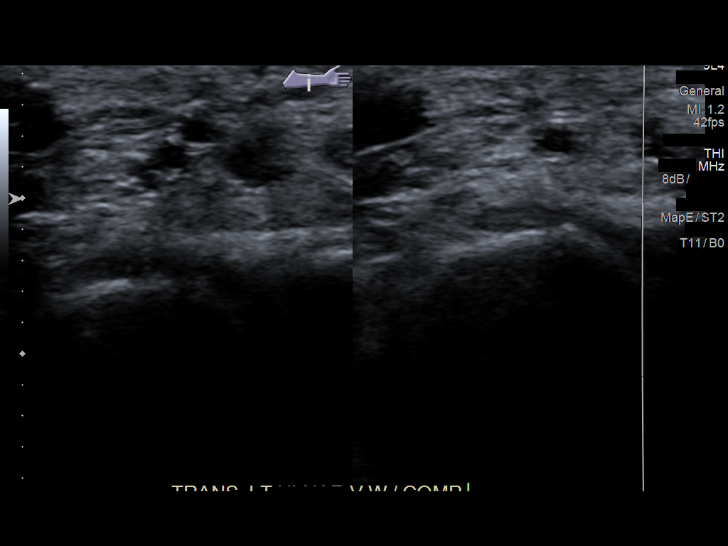
[im 36/36]
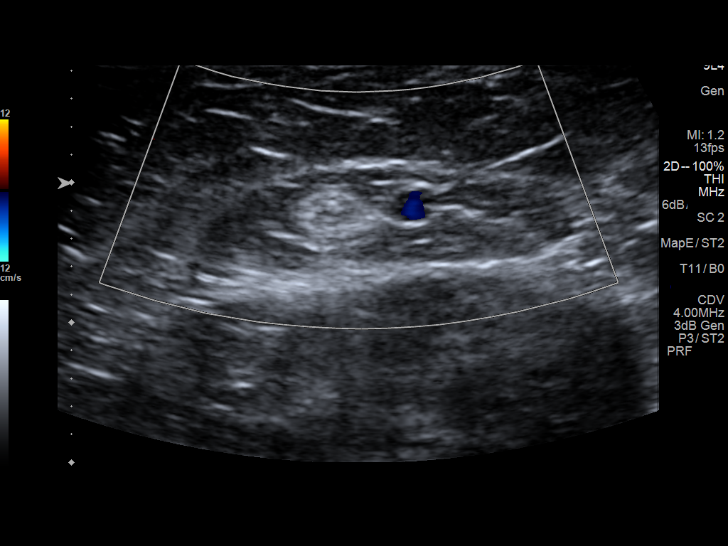

[13 of 24 positions shown; findings below may reference images not displayed]

FINDINGS: Contralateral Subclavian Vein: Respiratory phasicity is normal and
symmetric with the symptomatic side. No evidence of thrombus. Normal
compressibility.

Internal Jugular Vein: No evidence of thrombus. Normal
compressibility, respiratory phasicity and response to augmentation.

Subclavian Vein: No evidence of thrombus. Normal compressibility,
respiratory phasicity and response to augmentation.

Axillary Vein: No evidence of thrombus. Normal compressibility,
respiratory phasicity and response to augmentation.

Cephalic Vein: No evidence of thrombus. Normal compressibility,
respiratory phasicity and response to augmentation.

Basilic Vein: No evidence of thrombus. Normal compressibility,
respiratory phasicity and response to augmentation.

Brachial Veins: No evidence of thrombus. Normal compressibility,
respiratory phasicity and response to augmentation.

Radial Veins: No evidence of thrombus. Normal compressibility,
respiratory phasicity and response to augmentation.

Ulnar Veins: No evidence of thrombus. Normal compressibility,
respiratory phasicity and response to augmentation.

Venous Reflux:  None visualized.

Other Findings: There is a fairly well-defined approximately 0.6 x
0.9 x 0.4 cm hyperechoic nodule within the subcutaneous tissues the
upper outer arm, adjacent tube apparently separate from the adjacent
widely patent cephalic vein (images 40 through 42). No definitive
blood flow was demonstrated within this nodule.
IMPRESSION: 1. No evidence of DVT within the left upper extremity.
2. Indeterminate approximately 0.9 cm hyperechoic nodule within the
subcutaneous tissues of the left upper outer arm, nonspecific though
potentially representative of a lipoma and of uncertain though
doubtful clinical concern.
